# Patient Record
Sex: Female | Born: 1960 | Hispanic: No | Marital: Married | Smoking: Never smoker
Health system: Southern US, Community
[De-identification: ages and names within clinical notes are randomized; demographics above are authoritative.]

## PROBLEM LIST (undated history)

## (undated) DIAGNOSIS — I10 Essential (primary) hypertension: Secondary | ICD-10-CM

## (undated) DIAGNOSIS — K219 Gastro-esophageal reflux disease without esophagitis: Secondary | ICD-10-CM

## (undated) HISTORY — DX: Gastro-esophageal reflux disease without esophagitis: K21.9

## (undated) HISTORY — DX: Essential (primary) hypertension: I10

---

## 2007-08-30 ENCOUNTER — Ambulatory Visit: Payer: Self-pay | Admitting: Emergency Medicine

## 2011-04-29 ENCOUNTER — Inpatient Hospital Stay: Payer: Self-pay | Admitting: General Practice

## 2012-10-18 IMAGING — CR DG FOOT COMPLETE 3+V*L*
1 series · 3 of 3 positions shown · non-contrast
Comparison: none

REASON FOR EXAM: lateral foot pain after fall
COMMENTS:

PROCEDURE:     DXR - DXR FOOT LT COMP W/OBLIQUES  - April 30, 2011  [DATE]
RESULT:     Comparison: None.

[Series 1: view not recorded · 0.17mm/px · 3 of 3 slices shown]
[im 1/3]
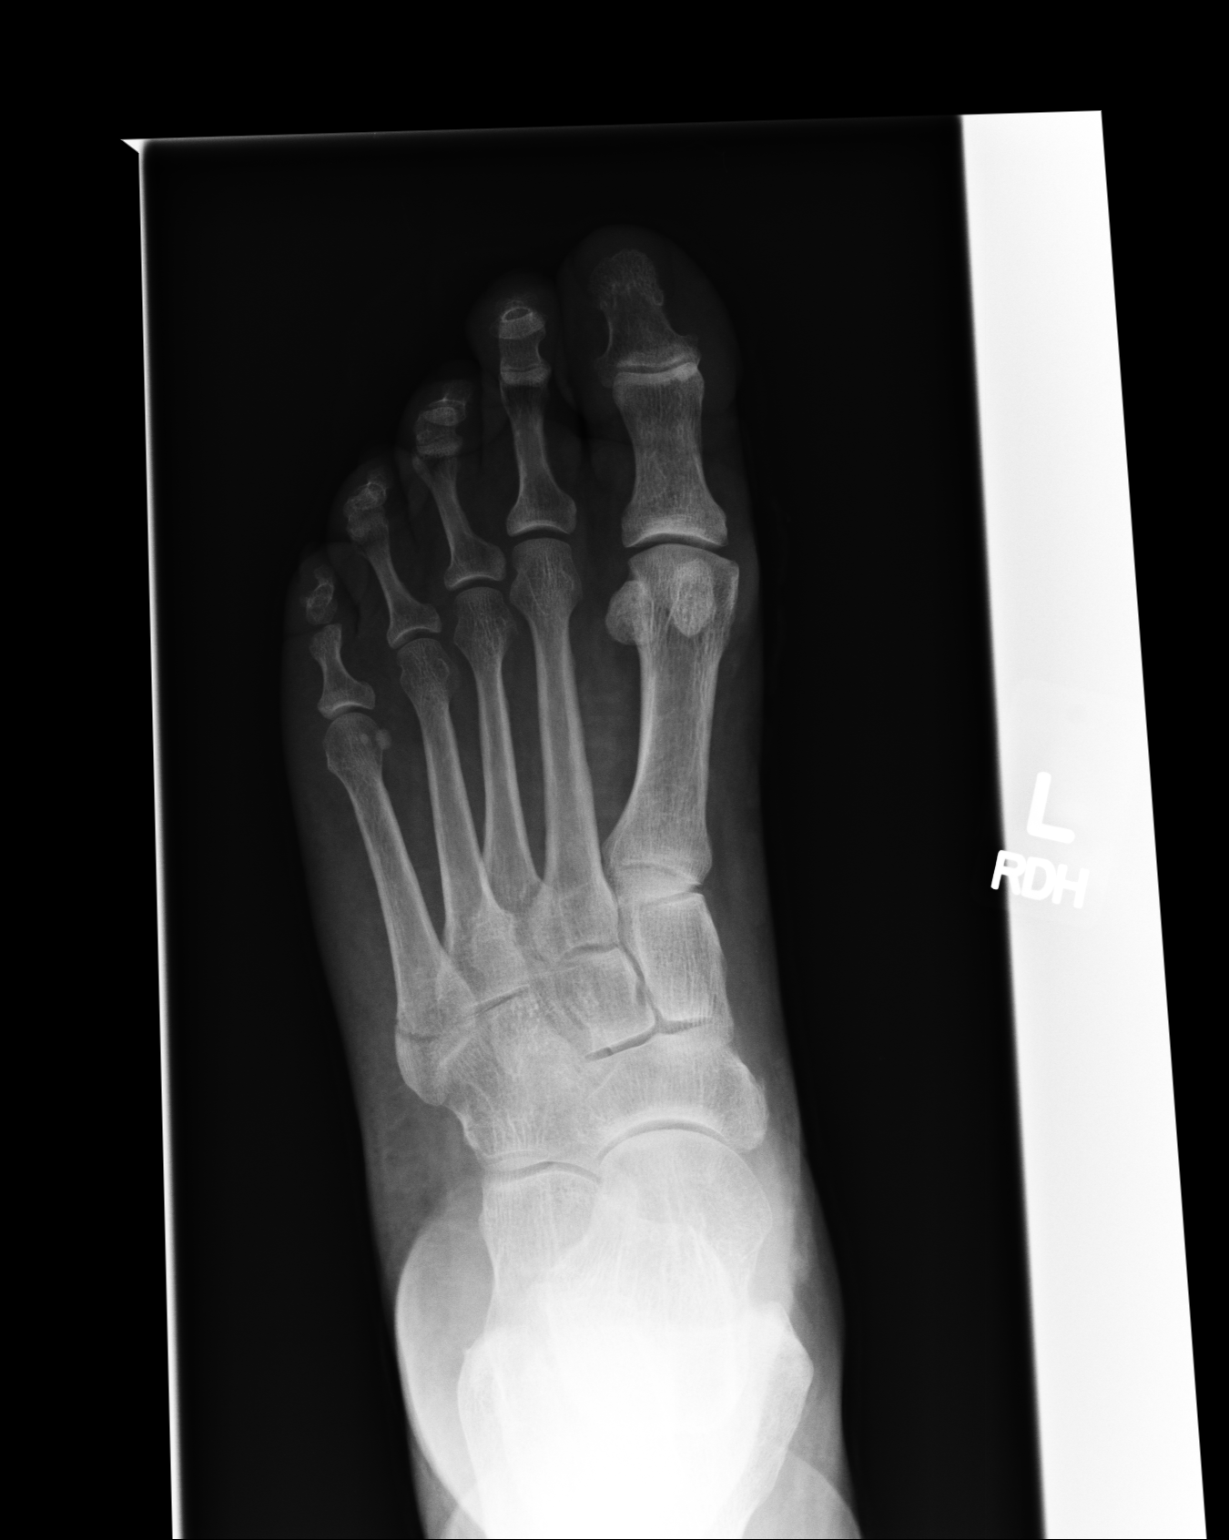
[im 2/3]
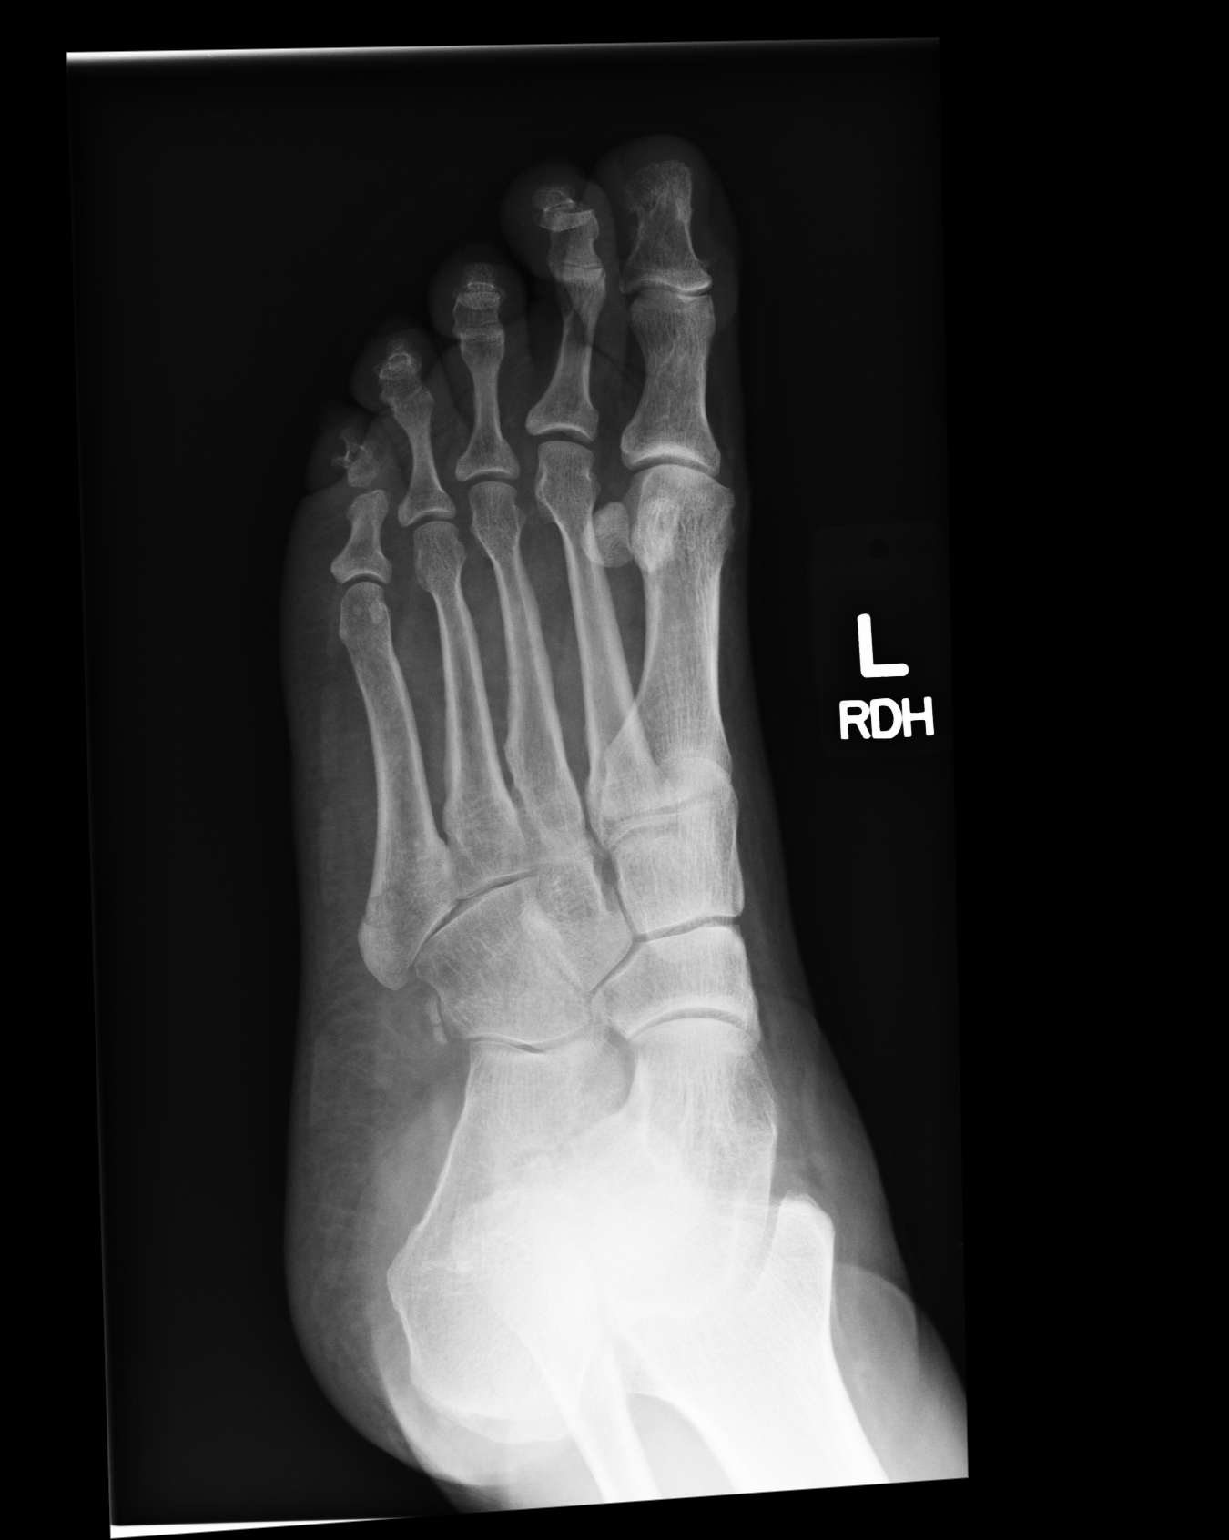
[im 3/3]
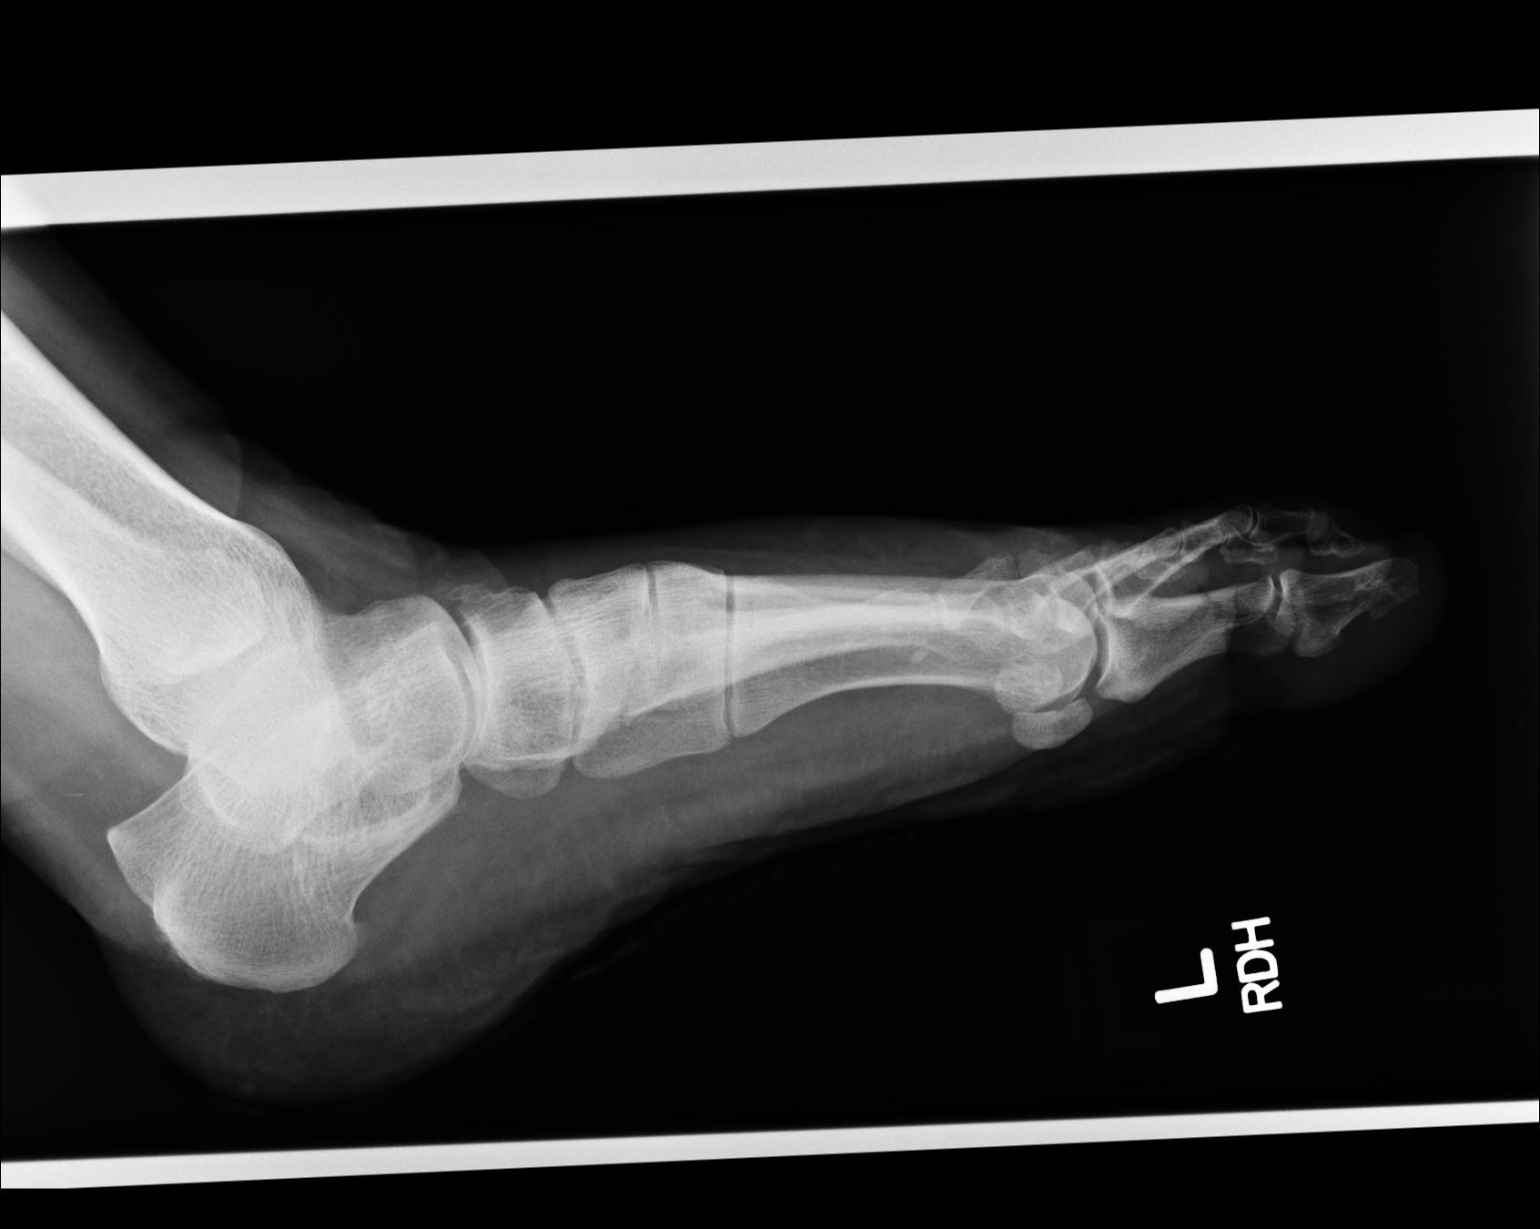

[3 of 3 positions shown; findings below may reference images not displayed]

FINDINGS: There is a minimally displaced fracture of the base of the fifth metatarsal.
The fracture line extends to the articulation with the cuboid.
IMPRESSION: Minimally displaced fracture at the base of the fifth metatarsal.

## 2015-06-18 ENCOUNTER — Encounter: Payer: Self-pay | Admitting: Podiatry

## 2015-06-18 ENCOUNTER — Ambulatory Visit (INDEPENDENT_AMBULATORY_CARE_PROVIDER_SITE_OTHER): Payer: BC Managed Care – PPO | Admitting: Podiatry

## 2015-06-18 ENCOUNTER — Ambulatory Visit (INDEPENDENT_AMBULATORY_CARE_PROVIDER_SITE_OTHER): Payer: BC Managed Care – PPO

## 2015-06-18 VITALS — BP 120/79 | HR 89 | Resp 18

## 2015-06-18 DIAGNOSIS — M722 Plantar fascial fibromatosis: Secondary | ICD-10-CM

## 2015-06-18 MED ORDER — DICLOFENAC SODIUM 75 MG PO TBEC: 75.0000 mg | DELAYED_RELEASE_TABLET | Freq: Two times a day (BID) | ORAL | Status: DC

## 2015-06-18 NOTE — Patient Instructions (Signed)

## 2015-06-18 NOTE — Progress Notes (Addendum)
   Subjective:    Patient ID: Phyllis Greene, female    DOB: [birth year], 54 y.o.   MRN: [id number]  HPI   54 year old female presents the office as concerns her right heel pain which has been ongoing for approximately one week and hurts on the bottom and she describes as a throbbing sensation. She states that she has pain when she sits down and she gets back inserts walking. She denies any history of injury or trauma. No change or increase in activity recently. Denies any swelling or redness. No tingling or numbness. She has been wearing an ankle brace but has not been helping. The pain is intermittent. The pain does not wake her up at night. No other complaints at this time.   Review of Systems  All other systems reviewed and are negative.      Objective:   Physical Exam General: AAO x3, NAD  Dermatological: Skin is warm, dry and supple bilateral. Nails x 10 are well manicured; remaining integument appears unremarkable at this time. There are no open sores, no preulcerative lesions, no rash or signs of infection present.  Vascular: Dorsalis Pedis artery and Posterior Tibial artery pedal pulses are 2/4 bilateral with immedate capillary fill time. Pedal hair growth present. No varicosities and no lower extremity edema present bilateral. There is no pain with calf compression, swelling, warmth, erythema.   Neruologic: Grossly intact via light touch bilateral. Vibratory intact via tuning fork bilateral. Protective threshold with Semmes Wienstein monofilament intact to all pedal sites bilateral. Patellar and Achilles deep tendon reflexes 2+ bilateral. No Babinski or clonus noted bilateral.   Musculoskeletal: Tenderness to palpation along the plantar medial tubercle of the calcaneus at the insertion of plantar fascia on the right foot. There is no pain along the course of the plantar fascia within the arch of the foot. Plantar fascia appears to be intact. There is no pain with lateral compression of  the calcaneus or pain with vibratory sensation. There is no pain along the course or insertion of the achilles tendon. No other areas of tenderness to bilateral lower extremities. Muscular strength 5/5 in all groups tested bilateral.  Gait: Unassisted, Nonantalgic.      Assessment & Plan:   54 year old female with right heel pain, likely plantar fasciitis -X-rays were obtained and reviewed with the patient.  -Treatment options discussed including all alternatives, risks, and complications -Etiology of symptoms were discussed -Patient elects to proceed with steroid injection into the right heel. Under sterile skin preparation, a total of 2.5cc of kenalog 10, 0.5% Marcaine plain, and 2% lidocaine plain were infiltrated into the symptomatic area without complication. A band-aid was applied. Patient tolerated the injection well without complication. Post-injection care with discussed with the patient. Discussed with the patient to ice the area over the next couple of days to help prevent a steroid flare.  -Dispensed night splint and plantar fascial brace. -Ice/stretching daily -Prescribed voltaren. Discussed side effects of the medication and directed to stop if any are to occur and call the office.  -Shoegear changes -Follow-up in 3 weeks or sooner if any problems arise. In the meantime, encouraged to call the office with any questions, concerns, change in symptoms.   Ovid CurdMatthew Aury Scollard, DPM

## 2015-06-19 DIAGNOSIS — M722 Plantar fascial fibromatosis: Secondary | ICD-10-CM | POA: Insufficient documentation

## 2015-07-09 ENCOUNTER — Ambulatory Visit (INDEPENDENT_AMBULATORY_CARE_PROVIDER_SITE_OTHER): Payer: BC Managed Care – PPO | Admitting: Podiatry

## 2015-07-09 ENCOUNTER — Encounter: Payer: Self-pay | Admitting: Podiatry

## 2015-07-09 VITALS — BP 121/76 | HR 97 | Resp 18

## 2015-07-09 DIAGNOSIS — M722 Plantar fascial fibromatosis: Secondary | ICD-10-CM | POA: Diagnosis not present

## 2015-07-10 NOTE — Progress Notes (Signed)
Patient ID: Phyllis Greene, female   DOB: April 26, 1961, 54 y.o.   MRN: [id number]  Subjective: Phyllis Greene presents to the office today for follow-up evaluation of right heel pain. They state that they are doing much better and not having any pain at this time. They have been icing, stretching, try to wear supportive shoe as much as possible. She is taking NSAID without any side effects.   No other complaints at this time. No acute changes since last appointment. They deny any systemic complaints such as fevers, chills, nausea, vomiting.  Objective: General: AAO x3, NAD  Dermatological: Skin is warm, dry and supple bilateral. Nails x 10 are well manicured; remaining integument appears unremarkable at this time. There are no open sores, no preulcerative lesions, no rash or signs of infection present.  Vascular: Dorsalis Pedis artery and Posterior Tibial artery pedal pulses are 2/4 bilateral with immedate capillary fill time. Pedal hair growth present. There is no pain with calf compression, swelling, warmth, erythema.   Neruologic: Grossly intact via light touch bilateral. Vibratory intact via tuning fork bilateral. Protective threshold with Semmes Wienstein monofilament intact to all pedal sites bilateral.   Musculoskeletal: There is no tenderness palpation along the plantar medial tubercle of the calcaneus at the insertion of the plantar fascia on the right foot. There is no pain along the course of the plantar fascia within the arch of the foot. Plantar fascia appears to be intact bilaterally. There is no pain with lateral compression of the calcaneus and there is no pain with vibratory sensation. There is no pain along the course or insertion of the Achilles tendon. There are no other areas of tenderness to bilateral lower extremities. No gross boney pedal deformities bilateral. No pain, crepitus, or limitation noted with foot and ankle range of motion bilateral. Muscular strength 5/5 in all groups  tested bilateral.  Gait: Unassisted, Nonantalgic.   Assessment: Presents for follow-up evaluation for heel pain, likely plantar fasciitis   Plan: -Treatment options discussed including all alternatives, risks, and complications -Will hold off on further steroid injection as she is having no pain at this time.  -Ice and stretching exercises on a daily basis. -Continue supportive shoe gear. She has inserts, continue to wear these. Monitor for reoccurrence.  -Follow-up  or sooner if any problems arise. In the meantime, encouraged to call the office with any questions, concerns, change in symptoms.   Ovid CurdMatthew Wagoner, DPM

## 2023-05-05 ENCOUNTER — Encounter: Payer: Self-pay | Admitting: Podiatry

## 2023-05-05 ENCOUNTER — Ambulatory Visit: Payer: BC Managed Care – PPO | Admitting: Podiatry

## 2023-05-05 ENCOUNTER — Ambulatory Visit (INDEPENDENT_AMBULATORY_CARE_PROVIDER_SITE_OTHER): Payer: BC Managed Care – PPO

## 2023-05-05 ENCOUNTER — Other Ambulatory Visit: Payer: Self-pay | Admitting: Podiatry

## 2023-05-05 VITALS — BP 138/89 | HR 86

## 2023-05-05 DIAGNOSIS — M779 Enthesopathy, unspecified: Secondary | ICD-10-CM | POA: Diagnosis not present

## 2023-05-05 DIAGNOSIS — M7752 Other enthesopathy of left foot: Secondary | ICD-10-CM | POA: Diagnosis not present

## 2023-05-05 MED ORDER — BETAMETHASONE SOD PHOS & ACET 6 (3-3) MG/ML IJ SUSP
3.0000 mg | Freq: Once | INTRAMUSCULAR | Status: AC
  Administered 2023-05-05: 3 mg via INTRA_ARTICULAR

## 2023-05-05 MED ORDER — MELOXICAM 15 MG PO TABS: 15.0000 mg | ORAL_TABLET | Freq: Every day | ORAL | 1 refills | Status: AC

## 2023-05-05 MED ORDER — METHYLPREDNISOLONE 4 MG PO TBPK: ORAL_TABLET | ORAL | 0 refills | Status: DC

## 2023-05-05 NOTE — Progress Notes (Signed)
   Chief Complaint  Patient presents with   Foot Pain    "I have pain on the side of my left foot." N - pain side of foot L - lateral left D - 4 weeks O - suddenly, gotten worse C - sharp pains, sore, travels to great toe A - pumps T - soaked it, Tylenol, Icy Hot    HPI: 62 y.o. female presenting today as a new patient for evaluation of pain and tenderness associated to the left foot ongoing for about 1 month now.  Patient states that it initiated when she wore a pair of 'pumps' shoes to church.  She has tried soaking the foot and applying IcyHot and taking Tylenol with minimal relief.  Past Medical History:  Diagnosis Date   GERD (gastroesophageal reflux disease)    Hypertension     No past surgical history on file.  No Known Allergies   Physical Exam: General: The patient is alert and oriented x3 in no acute distress.  Dermatology: Skin is warm, dry and supple bilateral lower extremities.   Vascular: Palpable pedal pulses bilaterally. Capillary refill within normal limits.  No appreciable edema.  No erythema.  Neurological: Grossly intact via light touch  Musculoskeletal Exam: No pedal deformities noted.  Pain on palpation and range of motion to the fifth MTP of the left foot  Radiographic Exam LT foot 05/05/2023:  Normal osseous mineralization. Joint spaces preserved.  No fractures or osseous irregularities noted.  Impression: Negative  Assessment/Plan of Care: 1.  Fifth MTP capsulitis left  -Patient evaluated.  X-rays reviewed -Injection of 0.5 cc Celestone Soluspan injected around the fifth MTP left -Postop shoe dispensed.  WBAT x 4 weeks -Prescription for Medrol Dosepak -Prescription for meloxicam 15 mg daily after completion of the Dosepak -Return to clinic 4 weeks     Felecia Shelling, DPM [facility]  Dr. Felecia Shelling, DPM    [address] [place], [place] [address]                Office [phone]  Fax [phone]

## 2023-06-06 ENCOUNTER — Encounter: Payer: Self-pay | Admitting: Podiatry

## 2023-06-06 ENCOUNTER — Ambulatory Visit (INDEPENDENT_AMBULATORY_CARE_PROVIDER_SITE_OTHER): Payer: BC Managed Care – PPO | Admitting: Podiatry

## 2023-06-06 DIAGNOSIS — M7752 Other enthesopathy of left foot: Secondary | ICD-10-CM | POA: Diagnosis not present

## 2023-06-06 NOTE — Progress Notes (Signed)
   Chief Complaint  Patient presents with   Foot Pain    Patient is here for 1 WK F/U for Fifth MTP capsulitis left     HPI: 62 y.o. female presenting today for follow-up evaluation of pain and tenderness to the fifth MTP of the left foot.  Patient states that the pain has completely resolved.  She denies any pain or tenderness to the foot.  Past Medical History:  Diagnosis Date   GERD (gastroesophageal reflux disease)    Hypertension     History reviewed. No pertinent surgical history.  No Known Allergies   Physical Exam: General: The patient is alert and oriented x3 in no acute distress.  Dermatology: Skin is warm, dry and supple bilateral lower extremities.   Vascular: Palpable pedal pulses bilaterally. Capillary refill within normal limits.  No appreciable edema.  No erythema.  Neurological: Grossly intact via light touch  Musculoskeletal Exam: No pedal deformities noted.  Negative for any significant pain on palpation and range of motion to the fifth MTP of the left foot  Radiographic Exam LT foot 05/05/2023:  Normal osseous mineralization. Joint spaces preserved.  No fractures or osseous irregularities noted.  Impression: Negative  Assessment/Plan of Care: 1.  Fifth MTP capsulitis left  -Patient evaluated.  -Continue meloxicam 15 mg as needed -Patient may transition out of the postsurgical shoe into good supportive tennis shoes and sneakers -Return to clinic as needed     Felecia Shelling, DPM [facility]  Dr. Felecia Shelling, DPM    [address] [place], [place] [address]                Office [phone]  Fax [phone]

## 2024-01-18 ENCOUNTER — Ambulatory Visit: Admitting: Podiatry

## 2024-01-18 DIAGNOSIS — T148XXA Other injury of unspecified body region, initial encounter: Secondary | ICD-10-CM | POA: Diagnosis not present

## 2024-01-18 NOTE — Progress Notes (Signed)
  Subjective:  Patient ID: Phyllis Greene, female    DOB: Dec 19, 1960,  MRN: [id number]  Chief Complaint  Patient presents with   Toe Pain    63 y.o. female presents with the above complaint.  Patient presents with right great toe contusion.  Patient states she tripped and fell on June 27.  Her foot got caught.  She just wanted get it evaluated she does not have any pain she is not a diabetic.  She wanted to discuss treatment options for pain scale about 10.  She is already wearing surgical shoes she went to her primary care doctor   Review of Systems: Negative except as noted in the HPI. Denies N/V/F/Ch.  Past Medical History:  Diagnosis Date   GERD (gastroesophageal reflux disease)    Hypertension     Current Outpatient Medications:    atorvastatin (LIPITOR) 20 MG tablet, Take 1 tablet by mouth daily., Disp: , Rfl:    escitalopram (LEXAPRO) 20 MG tablet, Take 20 mg by mouth., Disp: , Rfl:    lisinopril-hydrochlorothiazide (PRINZIDE,ZESTORETIC) 20-25 MG tablet, Take by mouth., Disp: , Rfl:    meloxicam  (MOBIC ) 15 MG tablet, Take 1 tablet (15 mg total) by mouth daily., Disp: 30 tablet, Rfl: 1   metFORMIN (GLUCOPHAGE-XR) 500 MG 24 hr tablet, Take 500 mg by mouth., Disp: , Rfl:   Social History   Tobacco Use  Smoking Status Never  Smokeless Tobacco Never    No Known Allergies Objective:  There were no vitals filed for this visit. There is no height or weight on file to calculate BMI. Constitutional Well developed. Well nourished.  Vascular Dorsalis pedis pulses palpable bilaterally. Posterior tibial pulses palpable bilaterally. Capillary refill normal to all digits.  No cyanosis or clubbing noted. Pedal hair growth normal.  Neurologic Normal speech. Oriented to person, place, and time. Epicritic sensation to light touch grossly present bilaterally.  Dermatologic Nails well groomed and normal in appearance. No open wounds. No skin lesions.  Orthopedic: Pain on palpation  right great toe.  No crepitus noted mild redness noted.  No open wounds or lesion noted.  No erythema or infectious redness noted.   Radiographs: None Assessment:   1. Contusion of soft tissue    Plan:  Patient was evaluated and treated and all questions answered.  Right great toe contusion improving - All questions and concerns were discussed with the patient in extensive detail given that the pain is improving for now we will continue wearing surgical shoe for another week and I will have her discontinue the shoe and go into her regular shoes.  I discussed with patient she states understanding if any foot and ankle issues in the future she will come back and see me.  No follow-ups on file.   Right great toe contusion no pain wearing surgical shoe happened on June 27 she tripped and fell.  Pain is improving continue wearing surgical shoe for another week then transition to regular shoes
